# Patient Record
Sex: Male | Born: 1970 | Hispanic: Yes | Marital: Married | Smoking: Never smoker
Health system: Southern US, Community
[De-identification: ages and names within clinical notes are randomized; demographics above are authoritative.]

## PROBLEM LIST (undated history)

## (undated) HISTORY — PX: APPENDECTOMY: SHX54

---

## 2016-11-27 ENCOUNTER — Emergency Department (HOSPITAL_COMMUNITY)
Admission: EM | Admit: 2016-11-27 | Discharge: 2016-11-27 | Disposition: A | Discharge status: Home / Self Care | Payer: Self-pay | Attending: Emergency Medicine | Admitting: Emergency Medicine

## 2016-11-27 ENCOUNTER — Encounter (HOSPITAL_COMMUNITY): Payer: Self-pay | Admitting: Emergency Medicine

## 2016-11-27 DIAGNOSIS — Z4802 Encounter for removal of sutures: Secondary | ICD-10-CM

## 2016-11-27 NOTE — ED Notes (Signed)
nad noted prior to dc. Dc instructions reviewed.  

## 2016-11-27 NOTE — ED Triage Notes (Signed)
Sutures placed Feb 2nd at [facility].  Suture removal to right thumb.  Denies any pain at this time.

## 2016-11-27 NOTE — Discharge Instructions (Signed)
Avoid excessive use or heavy lifting with the thumb for at least one week.  Call the orthopedic doctor listed to arrange a follow-up appt.

## 2016-11-30 NOTE — ED Provider Notes (Signed)
[facility]-EMERGENCY DEPT Provider Note   CSN: [id number] Arrival date & time: 11/27/16  1104     History   Chief Complaint Chief Complaint  Patient presents with  . Suture / Staple Removal    HPI Randall Briggs is a 46 y.o. male.  HPI  Randall Briggs is a 46 y.o. male who presents to the Emergency Department requesting sutures removal of his right thumb.  He states that he had a crush injury to his thumb while on a [place] in [place]  Thumb was sutured there and he was advised to follow-up with a hand specialist, but he didn't want to travel back to [place].  He states the sutures have been in place for 12 days.  He complains of difficulty bending his thumb, but denies pain, numbness, redness or drainage.  History reviewed. No pertinent past medical history.  There are no active problems to display for this patient.   Past Surgical History:  Procedure Laterality Date  . APPENDECTOMY         Home Medications    Prior to Admission medications   Not on File    Family History History reviewed. No pertinent family history.  Social History Social History  Substance Use Topics  . Smoking status: Never Smoker  . Smokeless tobacco: Never Used  . Alcohol use No     Allergies   Patient has no known allergies.   Review of Systems Review of Systems  Constitutional: Negative for chills and fever.  Musculoskeletal: Positive for joint swelling. Negative for arthralgias and back pain.  Skin: Positive for wound.       Laceration to right thumb with sutures in place.  Neurological: Negative for dizziness and numbness.  Hematological: Does not bruise/bleed easily.  All other systems reviewed and are negative.    Physical Exam Updated Vital Signs BP 138/95   Pulse 63   Temp 97.7 F (36.5 C)   Resp 18   Ht 5\' 4"  (1.626 m)   Wt 71.7 kg   SpO2 99%   BMI 27.12 kg/m   Physical Exam  Constitutional: He is oriented to person, place, and time. He  appears well-developed and well-nourished. No distress.  HENT:  Head: Normocephalic and atraumatic.  Cardiovascular: Normal rate, regular rhythm and intact distal pulses.   No murmur heard. Pulmonary/Chest: Effort normal and breath sounds normal. No respiratory distress.  Musculoskeletal: He exhibits no edema or tenderness.  Limited ROM of distal thumb. Right thumb nail sutured down with 2 sutures and 4 sutures in the skin of the distal thumb. Distal sensation of the thumb intact, laceration appears to be healing well.  Mild edema of the distal thumb with discoloration of the nail.    Neurological: He is alert and oriented to person, place, and time. He exhibits normal muscle tone. Coordination normal.  Skin: Skin is warm. Laceration noted.  Psychiatric: He has a normal mood and affect.  Nursing note and vitals reviewed.    ED Treatments / Results  Labs (all labs ordered are listed, but only abnormal results are displayed) Labs Reviewed - No data to display  EKG  EKG Interpretation None       Radiology No results found.  Procedures Procedures (including critical care time)  Medications Ordered in ED Medications - No data to display   Initial Impression / Assessment and Plan / ED Course  I have reviewed the triage vital signs and the nursing notes.  Pertinent labs &  imaging results that were available during my care of the patient were reviewed by me and considered in my medical decision making (see chart for details).     Sutures removed by nursing.  NV intact.  No clinical signs of infection. Possible tendon injury, given limited ROM of the distal thumb.  Pt advised to f/u with local orthopedics, referral info given.    Pt also seen by Dr. Estell Harpin care plan discussed.  Final Clinical Impressions(s) / ED Diagnoses   Final diagnoses:  Visit for suture removal    New Prescriptions There are no discharge medications for this patient.    Pauline Aus,  PA-C 11/30/16 1517    Bethann Berkshire, MD 12/01/16 1540

## 2016-12-03 ENCOUNTER — Telehealth: Payer: Self-pay | Admitting: Orthopedic Surgery

## 2016-12-03 NOTE — Telephone Encounter (Signed)
Please advise regarding this patient's visit to [facility] Emergency room 11/27/16, for problem of thumb nail injury, which occurred 11/14/16, and was initially treated in [place] (2 [place]2 hours away).   Patient is requesting initial treatment notes.  Notes indicate: "Right thumb nail sutured down with 2 sutures and 4 sutures in the skin of the distal thumb. Distal sensation of the thumb intact, laceration appears to be healing well.  Mild edema of the distal thumb with discoloration of the nail.     Schedule appointment or wait for notes for review when received?   Ph# [phone], or [phone] (designated contact person)

## 2016-12-04 NOTE — Telephone Encounter (Signed)
Spoke with clinical staff; still awaiting notes. Appointment pending notes.

## 2016-12-04 NOTE — Telephone Encounter (Signed)
CAN DR K SEE THIS PATIENT ?

## 2016-12-18 NOTE — Telephone Encounter (Signed)
Medical records received 12/15/16; came to my attention 12/17/16. Called both phone #'s to notify patient that [facility]; left message. (Appointment still pending Dr's review).

## 2016-12-23 NOTE — Telephone Encounter (Signed)
Dr Hilda LiasKeeling has reviewed notes, and approved schedule of appointment.  Called patient (both phone #'s); left message.

## 2016-12-24 ENCOUNTER — Ambulatory Visit (INDEPENDENT_AMBULATORY_CARE_PROVIDER_SITE_OTHER): Payer: Self-pay | Admitting: Orthopaedic Surgery

## 2016-12-24 ENCOUNTER — Ambulatory Visit (INDEPENDENT_AMBULATORY_CARE_PROVIDER_SITE_OTHER): Payer: Self-pay

## 2016-12-24 VITALS — BP 146/88 | HR 64 | Temp 97.5°F | Ht 65.0 in | Wt 168.0 lb

## 2016-12-24 DIAGNOSIS — S6991XA Unspecified injury of right wrist, hand and finger(s), initial encounter: Secondary | ICD-10-CM

## 2016-12-24 DIAGNOSIS — M79644 Pain in right finger(s): Secondary | ICD-10-CM

## 2016-12-24 NOTE — Progress Notes (Signed)
Subjective:    Patient ID: Randall Briggs, male    DOB: [birth year], 46 y.o.   MRN: [id number]  HPI He injured his right dominant thumb on 11-14-16.  He had loss of his nail on the thumb and required sutures to be done.  He was treated in [place] at the [facility].  I have notes of his visit of 11-21-16.  He was released then to go to work.  He has continued pain in the thumb.  He has no redness or drainage.  He has some lack of sensation of the tip of the thumb.   Review of Systems  HENT: Negative for congestion.   Respiratory: Negative for cough and shortness of breath.   Cardiovascular: Negative for chest pain and leg swelling.  Endocrine: Negative for cold intolerance.  Musculoskeletal: Positive for arthralgias.  Allergic/Immunologic: Negative for environmental allergies.   No past medical history on file.  Past Surgical History:  Procedure Laterality Date  . APPENDECTOMY      No current outpatient prescriptions on file prior to visit.   No current facility-administered medications on file prior to visit.     Social History   Social History  . Marital status: Married    Spouse name: N/A  . Number of children: N/A  . Years of education: N/A   Occupational History  . Not on file.   Social History Main Topics  . Smoking status: Never Smoker  . Smokeless tobacco: Never Used  . Alcohol use No  . Drug use: No  . Sexual activity: Yes   Other Topics Concern  . Not on file   Social History Narrative  . No narrative on file    No family history on file.  BP (!) 146/88   Pulse 64   Temp 97.5 F (36.4 C)   Ht 5\' 5"  (1.651 m)   Wt 168 lb (76.2 kg)   BMI 27.96 kg/m      Objective:   Physical Exam  Constitutional: He is oriented to person, place, and time. He appears well-developed and well-nourished.  HENT:  Head: Normocephalic and atraumatic.  Eyes: Conjunctivae and EOM are normal. Pupils are equal, round, and reactive to light.  Neck: Normal  range of motion. Neck supple.  Cardiovascular: Normal rate, regular rhythm and intact distal pulses.   Pulmonary/Chest: Effort normal.  Abdominal: Soft.  Musculoskeletal: He exhibits tenderness (The right thumb has partial nail growing in but the rest of the nail has been removed. He has sutures in the tip of the thumb.  there is slight decreased sensation of the tip.  ROM IP is tender and limited.  His grip is poor.).  Neurological: He is alert and oriented to person, place, and time. He has normal reflexes. He displays normal reflexes. No cranial nerve deficit. He exhibits normal muscle tone. Coordination normal.  Skin: Skin is warm and dry.  Psychiatric: He has a normal mood and affect. His behavior is normal. Judgment and thought content normal.  Vitals reviewed.         Assessment & Plan:   Encounter Diagnoses  Name Primary?  . Thumb pain, right Yes  . Thumb injury, right, initial encounter    He needs to move and exercise his right thumb and fingers.  I have explained how to do this.  The sutures were removed.  It will take about three months or more for the nail to grow back.  It will take up to about six  months for full sensation to return.  I have gone over desensitization techniques for him.  Return in six weeks.  Call if doing well and cancel.  Electronically Signed Darreld McleanWayne Analys Ryden, MD 3/14/201811:08 AM

## 2016-12-24 NOTE — Telephone Encounter (Signed)
Patient aware of appointment for 12/24/16; completed as noted.

## 2017-02-04 ENCOUNTER — Ambulatory Visit: Payer: Self-pay | Admitting: Orthopaedic Surgery

## 2019-09-22 ENCOUNTER — Other Ambulatory Visit: Payer: Self-pay

## 2019-09-22 DIAGNOSIS — Z20822 Contact with and (suspected) exposure to covid-19: Secondary | ICD-10-CM

## 2019-09-24 LAB — NOVEL CORONAVIRUS, NAA: SARS-CoV-2, NAA: NOT DETECTED

## 2021-12-02 ENCOUNTER — Other Ambulatory Visit: Payer: Self-pay

## 2021-12-02 ENCOUNTER — Encounter: Payer: Self-pay | Admitting: Emergency Medicine

## 2021-12-02 DIAGNOSIS — K529 Noninfective gastroenteritis and colitis, unspecified: Secondary | ICD-10-CM | POA: Insufficient documentation

## 2021-12-02 DIAGNOSIS — Z20822 Contact with and (suspected) exposure to covid-19: Secondary | ICD-10-CM | POA: Insufficient documentation

## 2021-12-02 LAB — COMPREHENSIVE METABOLIC PANEL
ALT: 17 U/L (ref 0–44)
AST: 20 U/L (ref 15–41)
Albumin: 4.1 g/dL (ref 3.5–5.0)
Alkaline Phosphatase: 89 U/L (ref 38–126)
Anion gap: 9 (ref 5–15)
BUN: 13 mg/dL (ref 6–20)
CO2: 25 mmol/L (ref 22–32)
Calcium: 8.8 mg/dL — ABNORMAL LOW (ref 8.9–10.3)
Chloride: 101 mmol/L (ref 98–111)
Creatinine, Ser: 1.03 mg/dL (ref 0.61–1.24)
GFR, Estimated: >60 mL/min (ref >60)
Glucose, Bld: 136 mg/dL — ABNORMAL HIGH (ref 70–99)
Potassium: 3.9 mmol/L (ref 3.5–5.1)
Sodium: 135 mmol/L (ref 135–145)
Total Bilirubin: 1.4 mg/dL — ABNORMAL HIGH (ref 0.3–1.2)
Total Protein: 7.5 g/dL (ref 6.5–8.1)

## 2021-12-02 LAB — URINALYSIS, ROUTINE W REFLEX MICROSCOPIC
Bilirubin Urine: NEGATIVE
Glucose, UA: NEGATIVE mg/dL
Hgb urine dipstick: NEGATIVE
Ketones, ur: NEGATIVE mg/dL
Leukocytes,Ua: NEGATIVE
Nitrite: NEGATIVE
Protein, ur: NEGATIVE mg/dL
Specific Gravity, Urine: 1.025 (ref 1.005–1.030)
pH: 6 (ref 5.0–8.0)

## 2021-12-02 LAB — CBC WITH DIFFERENTIAL/PLATELET
Abs Immature Granulocytes: 0.07 10*3/uL (ref 0.00–0.07)
Basophils Absolute: 0.1 10*3/uL (ref 0.0–0.1)
Basophils Relative: 0 %
Eosinophils Absolute: 0.1 10*3/uL (ref 0.0–0.5)
Eosinophils Relative: 1 %
HCT: 44.3 % (ref 39.0–52.0)
Hemoglobin: 15.4 g/dL (ref 13.0–17.0)
Immature Granulocytes: 1 %
Lymphocytes Relative: 8 %
Lymphs Abs: 1.2 10*3/uL (ref 0.7–4.0)
MCH: 30.8 pg (ref 26.0–34.0)
MCHC: 34.8 g/dL (ref 30.0–36.0)
MCV: 88.6 fL (ref 80.0–100.0)
Monocytes Absolute: 0.7 10*3/uL (ref 0.1–1.0)
Monocytes Relative: 5 %
Neutro Abs: 12.9 10*3/uL — ABNORMAL HIGH (ref 1.7–7.7)
Neutrophils Relative %: 85 %
Platelets: 251 10*3/uL (ref 150–400)
RBC: 5 MIL/uL (ref 4.22–5.81)
RDW: 12.2 % (ref 11.5–15.5)
WBC: 15.1 10*3/uL — ABNORMAL HIGH (ref 4.0–10.5)
nRBC: 0 % (ref 0.0–0.2)

## 2021-12-02 LAB — LIPASE, BLOOD: Lipase: 45 U/L (ref 11–51)
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
## 2021-12-03 ENCOUNTER — Emergency Department: Payer: Self-pay

## 2021-12-03 ENCOUNTER — Emergency Department
Admission: EM | Admit: 2021-12-03 | Discharge: 2021-12-03 | Disposition: A | Discharge status: Home / Self Care | Payer: Self-pay | Attending: Emergency Medicine | Admitting: Emergency Medicine

## 2021-12-03 DIAGNOSIS — K529 Noninfective gastroenteritis and colitis, unspecified: Secondary | ICD-10-CM

## 2021-12-03 LAB — RESP PANEL BY RT-PCR (FLU A&B, COVID) ARPGX2
Influenza A by PCR: NEGATIVE
Influenza B by PCR: NEGATIVE
SARS Coronavirus 2 by RT PCR: NEGATIVE

## 2021-12-03 LAB — LACTIC ACID, PLASMA: Lactic Acid, Venous: 0.9 mmol/L (ref 0.5–1.9)

## 2021-12-03 MED ORDER — LACTATED RINGERS IV BOLUS
1000.0000 mL | Freq: Once | INTRAVENOUS | Status: AC
  Administered 2021-12-03: 1000 mL via INTRAVENOUS

## 2021-12-03 MED ORDER — IOHEXOL 300 MG/ML  SOLN
100.0000 mL | Freq: Once | INTRAMUSCULAR | Status: AC | PRN
  Administered 2021-12-03: 100 mL via INTRAVENOUS

## 2021-12-03 MED ORDER — AMOXICILLIN-POT CLAVULANATE 875-125 MG PO TABS: 1.0000 | ORAL_TABLET | Freq: Two times a day (BID) | ORAL | 0 refills | Status: AC

## 2021-12-03 MED ORDER — PIPERACILLIN-TAZOBACTAM 3.375 G IVPB 30 MIN
3.3750 g | Freq: Once | INTRAVENOUS | Status: AC
  Administered 2021-12-03: 3.375 g via INTRAVENOUS
  Filled 2021-12-03: qty 50

## 2021-12-03 MED ORDER — DICYCLOMINE HCL 10 MG PO CAPS: 10.0000 mg | ORAL_CAPSULE | Freq: Three times a day (TID) | ORAL | 0 refills | Status: AC

## 2021-12-03 MED ORDER — ACETAMINOPHEN 500 MG PO TABS
1000.0000 mg | ORAL_TABLET | Freq: Once | ORAL | Status: AC
  Administered 2021-12-03: 1000 mg via ORAL
  Filled 2021-12-03: qty 2

## 2021-12-03 MED ORDER — ONDANSETRON HCL 4 MG PO TABS: 4.0000 mg | ORAL_TABLET | Freq: Three times a day (TID) | ORAL | 0 refills | Status: AC | PRN

## 2021-12-03 MED ORDER — MORPHINE SULFATE (PF) 4 MG/ML IV SOLN
4.0000 mg | Freq: Once | INTRAVENOUS | Status: AC
  Administered 2021-12-03: 4 mg via INTRAVENOUS
  Filled 2021-12-03: qty 1

## 2021-12-03 MED ORDER — IBUPROFEN 400 MG PO TABS
400.0000 mg | ORAL_TABLET | Freq: Once | ORAL | Status: AC
  Administered 2021-12-03: 400 mg via ORAL
  Filled 2021-12-03: qty 1

## 2021-12-03 MED ORDER — ONDANSETRON HCL 4 MG/2ML IJ SOLN
4.0000 mg | Freq: Once | INTRAMUSCULAR | Status: AC
Start: 2021-12-03 — End: 2021-12-03
  Administered 2021-12-03: 4 mg via INTRAVENOUS
  Filled 2021-12-03: qty 2
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
## 2021-12-08 LAB — CULTURE, BLOOD (ROUTINE X 2)
Culture: NO GROWTH
Culture: NO GROWTH
Special Requests: ADEQUATE
Special Requests: ADEQUATE
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
